# Patient Record
Sex: Female | Born: 2005 | Race: Black or African American | Hispanic: No | Marital: Single | Smoking: Never smoker
Health system: Southern US, Community
[De-identification: ages and names within clinical notes are randomized; demographics above are authoritative.]

## PROBLEM LIST (undated history)

## (undated) ENCOUNTER — Ambulatory Visit: Admission: EM | Source: Home / Self Care

## (undated) HISTORY — PX: NO PAST SURGERIES: SHX2092

---
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
## 2024-04-13 ENCOUNTER — Ambulatory Visit
Admission: EM | Admit: 2024-04-13 | Discharge: 2024-04-13 | Disposition: A | Discharge status: Home / Self Care | Attending: Emergency Medicine | Admitting: Emergency Medicine

## 2024-04-13 DIAGNOSIS — N898 Other specified noninflammatory disorders of vagina: Secondary | ICD-10-CM | POA: Diagnosis not present

## 2024-04-13 DIAGNOSIS — R300 Dysuria: Secondary | ICD-10-CM | POA: Diagnosis not present

## 2024-04-13 DIAGNOSIS — R310 Gross hematuria: Secondary | ICD-10-CM

## 2024-04-13 DIAGNOSIS — Z113 Encounter for screening for infections with a predominantly sexual mode of transmission: Secondary | ICD-10-CM

## 2024-04-13 LAB — POCT URINALYSIS DIP (MANUAL ENTRY)
Bilirubin, UA: NEGATIVE
Glucose, UA: NEGATIVE mg/dL
Ketones, POC UA: NEGATIVE mg/dL
Nitrite, UA: NEGATIVE
Protein Ur, POC: >=300 mg/dL — AB
Spec Grav, UA: 1.025 (ref 1.010–1.025)
Urobilinogen, UA: 0.2 U/dL
pH, UA: 5.5 (ref 5.0–8.0)

## 2024-04-13 LAB — POCT URINE PREGNANCY: Preg Test, Ur: NEGATIVE

## 2024-04-13 MED ORDER — CEPHALEXIN 500 MG PO CAPS: 500.0000 mg | ORAL_CAPSULE | Freq: Three times a day (TID) | ORAL | 0 refills | Status: AC

## 2024-04-13 NOTE — ED Triage Notes (Addendum)
 Pt c/o urinary frequency, burning with urination, blood in urine x 1 day. Pt states she is having a vaginal odor that started Monday.

## 2024-04-13 NOTE — Discharge Instructions (Addendum)
 Take the antibiotic as directed.  The urine culture is pending.  We will call you if it shows the need to change or discontinue your antibiotic.    Your vaginal tests are pending.  If your test results are positive, we will call you.  Do not have sexual activity for at least 7 days.    Follow up with your primary care provider on Monday.  Go to the emergency department if you have worsening symptoms.

## 2024-04-13 NOTE — ED Provider Notes (Signed)
 Julia Sims    CSN: [id number] Arrival date & time: 04/13/24  1417      History   Chief Complaint Chief Complaint  Patient presents with   Urinary Frequency    HPI Julia Sims is a 18 y.o. female.  Patient presents with 1 day history of dysuria, urinary frequency, and hematuria.  She also reports vaginal odor x 4 days.  She denies fever, chills, abdominal pain, flank pain, vaginal discharge, pelvic pain.  No OTC medication taken today.  The history is provided by the patient and medical records.    History reviewed. No pertinent past medical history.  There are no active problems to display for this patient.   Past Surgical History:  Procedure Laterality Date   NO PAST SURGERIES      OB History   No obstetric history on file.      Home Medications    Prior to Admission medications   Medication Sig Start Date End Date Taking? Authorizing Provider  cephALEXin (KEFLEX) 500 MG capsule Take 1 capsule (500 mg total) by mouth 3 (three) times daily for 5 days. 04/13/24 04/18/24 Yes Corlis Burnard DEL, NP  medroxyPROGESTERone  (PROVERA ) 10 MG tablet Take 1 tablet (10 mg total) by mouth daily for 7 days. 10/20/23 10/27/23  Brimage, Vondra, DO  ondansetron  (ZOFRAN -ODT) 4 MG disintegrating tablet Take 1 tablet (4 mg total) by mouth every 8 (eight) hours as needed. 10/20/23   Brimage, Vondra, DO    Family History History reviewed. No pertinent family history.  Social History Social History   Tobacco Use   Smoking status: Never   Smokeless tobacco: Never  Vaping Use   Vaping status: Every Day  Substance Use Topics   Alcohol use: Yes    Comment: rare   Drug use: Yes    Types: Marijuana    Comment: daily     Allergies   Patient has no known allergies.   Review of Systems Review of Systems  Constitutional:  Negative for chills and fever.  Gastrointestinal:  Negative for abdominal pain.  Genitourinary:  Positive for dysuria, frequency and hematuria. Negative for  flank pain, pelvic pain and vaginal discharge.     Physical Exam Triage Vital Signs ED Triage Vitals [04/13/24 1427]  Encounter Vitals Group     BP 103/70     Girls Systolic BP Percentile      Girls Diastolic BP Percentile      Boys Systolic BP Percentile      Boys Diastolic BP Percentile      Pulse Rate 89     Resp 18     Temp 97.9 F (36.6 C)     Temp src      SpO2 97 %     Weight      Height      Head Circumference      Peak Flow      Pain Score      Pain Loc      Pain Education      Exclude from Growth Chart    No data found.  Updated Vital Signs BP 103/70   Pulse 89   Temp 97.9 F (36.6 C)   Resp 18   LMP 03/30/2024 (Approximate)   SpO2 97%   Visual Acuity Right Eye Distance:   Left Eye Distance:   Bilateral Distance:    Right Eye Near:   Left Eye Near:    Bilateral Near:     Physical Exam Constitutional:  General: She is not in acute distress. HENT:     Mouth/Throat:     Mouth: Mucous membranes are moist.  Cardiovascular:     Rate and Rhythm: Normal rate and regular rhythm.  Pulmonary:     Effort: Pulmonary effort is normal. No respiratory distress.  Abdominal:     Palpations: Abdomen is soft.     Tenderness: There is no abdominal tenderness. There is no right CVA tenderness, left CVA tenderness, guarding or rebound.  Genitourinary:    Comments: Patient declines GU exam. Neurological:     Mental Status: She is alert.      UC Treatments / Results  Labs (all labs ordered are listed, but only abnormal results are displayed) Labs Reviewed  POCT URINALYSIS DIP (MANUAL ENTRY) - Abnormal; Notable for the following components:      Result Value   Clarity, UA cloudy (*)    Blood, UA large (*)    Protein Ur, POC >=300 (*)    Leukocytes, UA Moderate (2+) (*)    All other components within normal limits  URINE CULTURE  POCT URINE PREGNANCY  CERVICOVAGINAL ANCILLARY ONLY    EKG   Radiology No results  found.  Procedures Procedures (including critical care time)  Medications Ordered in UC Medications - No data to display  Initial Impression / Assessment and Plan / UC Course  I have reviewed the triage vital signs and the nursing notes.  Pertinent labs & imaging results that were available during my care of the patient were reviewed by me and considered in my medical decision making (see chart for details).    Gross hematuria, dysuria, vaginal odor, STD screening.  Afebrile and vital signs are stable.  Patient declines GU exam today.  Treating with Keflex. Urine culture pending. Discussed with patient that we will call her if the urine culture shows the need to change or discontinue the antibiotic. Patient obtained vaginal self swab for testing.  Discussed that we will call if test results are positive.  Discussed that she may require treatment at that time.  Instructed patient to abstain from sexual activity for at least 7 days.  Instructed her to follow-up with her PCP on Monday.  ED precautions discussed.  Patient agrees to plan of care.      Final Clinical Impressions(s) / UC Diagnoses   Final diagnoses:  Gross hematuria  Dysuria  Vaginal odor  Screening for STD (sexually transmitted disease)     Discharge Instructions      Take the antibiotic as directed.  The urine culture is pending.  We will call you if it shows the need to change or discontinue your antibiotic.    Your vaginal tests are pending.  If your test results are positive, we will call you.  Do not have sexual activity for at least 7 days.    Follow up with your primary care provider on Monday.  Go to the emergency department if you have worsening symptoms.          ED Prescriptions     Medication Sig Dispense Auth. Provider   cephALEXin (KEFLEX) 500 MG capsule Take 1 capsule (500 mg total) by mouth 3 (three) times daily for 5 days. 15 capsule Corlis Burnard DEL, NP      PDMP not reviewed this encounter.    Corlis Burnard DEL, NP 04/13/24 1447

## 2024-04-15 LAB — URINE CULTURE: Culture: 40000 — AB

## 2024-04-17 ENCOUNTER — Ambulatory Visit (HOSPITAL_COMMUNITY): Payer: Self-pay

## 2024-04-17 LAB — CERVICOVAGINAL ANCILLARY ONLY
Bacterial Vaginitis (gardnerella): POSITIVE — AB
Candida Glabrata: NEGATIVE
Candida Vaginitis: NEGATIVE
Chlamydia: NEGATIVE
Comment: NEGATIVE
Comment: NEGATIVE
Comment: NEGATIVE
Comment: NEGATIVE
Comment: NEGATIVE
Comment: NORMAL
Neisseria Gonorrhea: NEGATIVE
Trichomonas: NEGATIVE

## 2024-04-18 MED ORDER — METRONIDAZOLE 500 MG PO TABS: 500.0000 mg | ORAL_TABLET | Freq: Two times a day (BID) | ORAL | 0 refills | Status: AC

## 2024-09-03 ENCOUNTER — Other Ambulatory Visit: Payer: Self-pay

## 2024-09-03 ENCOUNTER — Emergency Department
Admission: EM | Admit: 2024-09-03 | Discharge: 2024-09-03 | Disposition: A | Discharge status: Home / Self Care | Attending: Emergency Medicine | Admitting: Emergency Medicine

## 2024-09-03 ENCOUNTER — Encounter: Payer: Self-pay | Admitting: Emergency Medicine

## 2024-09-03 DIAGNOSIS — M542 Cervicalgia: Secondary | ICD-10-CM | POA: Diagnosis present

## 2024-09-03 DIAGNOSIS — Y92410 Unspecified street and highway as the place of occurrence of the external cause: Secondary | ICD-10-CM | POA: Insufficient documentation

## 2024-09-03 DIAGNOSIS — R202 Paresthesia of skin: Secondary | ICD-10-CM

## 2024-09-03 DIAGNOSIS — S161XXA Strain of muscle, fascia and tendon at neck level, initial encounter: Secondary | ICD-10-CM | POA: Diagnosis not present

## 2024-09-03 DIAGNOSIS — M62830 Muscle spasm of back: Secondary | ICD-10-CM | POA: Diagnosis not present

## 2024-09-03 MED ORDER — ACETAMINOPHEN 500 MG PO TABS
1000.0000 mg | ORAL_TABLET | Freq: Once | ORAL | Status: AC
  Administered 2024-09-03: 1000 mg via ORAL
  Filled 2024-09-03: qty 2

## 2024-09-03 MED ORDER — KETOROLAC TROMETHAMINE 30 MG/ML IJ SOLN
30.0000 mg | Freq: Once | INTRAMUSCULAR | Status: AC
  Administered 2024-09-03: 30 mg via INTRAMUSCULAR
  Filled 2024-09-03: qty 1

## 2024-09-03 MED ORDER — LIDOCAINE 5 % EX PTCH
1.0000 | MEDICATED_PATCH | Freq: Two times a day (BID) | CUTANEOUS | 1 refills | Status: AC
Start: 2024-09-03 — End: 2025-09-03

## 2024-09-03 MED ORDER — LIDOCAINE 5 % EX PTCH
2.0000 | MEDICATED_PATCH | CUTANEOUS | Status: DC
  Administered 2024-09-03: 2 via TRANSDERMAL
  Filled 2024-09-03: qty 2
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
## 2024-09-03 NOTE — Discharge Instructions (Addendum)
Please take Tylenol and ibuprofen/Advil for your pain.  It is safe to take them together, or to alternate them every few hours.  Take up to 1000mg of Tylenol at a time, up to 4 times per day.  Do not take more than 4000 mg of Tylenol in 24 hours.  For ibuprofen, take 400-600 mg, 3 - 4 times per day.  Please use lidocaine patches at your site of pain.  Apply 1 patch at a time, leave on for 12 hours, then remove for 12 hours.  12 hours on, 12 hours off.  Do not apply more than 1 patch at a time.  
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
## 2024-09-21 ENCOUNTER — Telehealth: Admitting: Physician Assistant

## 2024-09-21 DIAGNOSIS — N939 Abnormal uterine and vaginal bleeding, unspecified: Secondary | ICD-10-CM

## 2024-09-21 NOTE — Patient Instructions (Signed)
°  Julia Sims, thank you for joining Elsie Velma Lunger, PA-C for today's virtual visit.  While this provider is not your primary care provider (PCP), if your PCP is located in our provider database this encounter information will be shared with them immediately following your visit.   A [facility] MyChart account gives you access to today's visit and all your visits, tests, and labs performed at [facility]  click here if you don't have a [facility] MyChart account or go to [email or web address]  Consent: (Patient) Julia Sims provided verbal consent for this virtual visit at the beginning of the encounter.  Current Medications:  Current Outpatient Medications:    lidocaine  (LIDODERM ) 5 %, Place 1 patch onto the skin every 12 (twelve) hours. Remove & Discard patch within 12 hours or as directed by MD, Disp: 10 patch, Rfl: 1   medroxyPROGESTERone  (PROVERA ) 10 MG tablet, Take 1 tablet (10 mg total) by mouth daily for 7 days., Disp: 7 tablet, Rfl: 0   ondansetron  (ZOFRAN -ODT) 4 MG disintegrating tablet, Take 1 tablet (4 mg total) by mouth every 8 (eight) hours as needed., Disp: 20 tablet, Rfl: 0   Medications ordered in this encounter:  No orders of the defined types were placed in this encounter.    *If you need refills on other medications prior to your next appointment, please contact your pharmacy*  Follow-Up: Call back or seek an in-person evaluation if the symptoms worsen or if the condition fails to improve as anticipated.  [facility] [phone]  Other Instructions Take prescribed medications as directed. Tylenol  and Ibuprofen OTC for cramping. Heating pad to the area  5-10 minutes, a few times per days. If you note any non-resolving, new, or worsening symptoms despite treatment, please seek an in-person evaluation ASAP.     If you have been instructed to have an in-person evaluation today at a local Urgent Care facility, please use the  link below. It will take you to a list of all of our available [facility], including address, phone number and hours of operation. Please do not delay care.  [facility]  If you or a family member do not have a primary care provider, use the link below to schedule a visit and establish care. When you choose a [facility] primary care physician or advanced practice provider, you gain a long-term partner in health. Find a Primary Care Provider  Learn more about [facility]'s in-office and virtual care options: [facility] - Get Care Now

## 2024-09-21 NOTE — Progress Notes (Signed)
 Virtual Visit Consent   Julia Sims, you are scheduled for a virtual visit with a [facility] provider today. Just as with appointments in the office, your consent must be obtained to participate. Your consent will be active for this visit and any virtual visit you may have with one of our providers in the next 365 days. If you have a MyChart account, a copy of this consent can be sent to you electronically.  As this is a virtual visit, video technology does not allow for your provider to perform a traditional examination. This may limit your provider's ability to fully assess your condition. If your provider identifies any concerns that need to be evaluated in person or the need to arrange testing (such as labs, EKG, etc.), we will make arrangements to do so. Although advances in technology are sophisticated, we cannot ensure that it will always work on either your end or our end. If the connection with a video visit is poor, the visit may have to be switched to a telephone visit. With either a video or telephone visit, we are not always able to ensure that we have a secure connection.  By engaging in this virtual visit, you consent to the provision of healthcare and authorize for your insurance to be billed (if applicable) for the services provided during this visit. Depending on your insurance coverage, you may receive a charge related to this service.  I need to obtain your verbal consent now. Are you willing to proceed with your visit today? Julia Sims has provided verbal consent on 09/21/2024 for a virtual visit (video or telephone). Julia Sims, [place]  Date: 09/21/2024 7:34 PM   Virtual Visit via Video Note   I, Julia Sims, connected with  Linnette Panella  ([id number], Sep 22, 2006) on 09/21/2024 at  7:15 PM EST by a video-enabled telemedicine application and verified that I am speaking with the correct person using two identifiers.  Location: Patient: Virtual Visit Location  Patient: Home Provider: Virtual Visit Location Provider: Home Office   I discussed the limitations of evaluation and management by telemedicine and the availability of in person appointments. The patient expressed understanding and agreed to proceed.    History of Present Illness: Julia Sims is a 18 y.o. who identifies as a female who was assigned female at birth, and is being seen today for issue with abnormal uterine bleeding. Notes over past severe months having menstrual periods every couple of weeks, normal to heavy flow, lasting several days. Currently cycle started in the past day with heavy cramping and heavy flow. Denies lightheadedness, dizziness or shortness of breath. Is taking Ibuprofen with some relief in cramping.   HPI: HPI  Problems: There are no active problems to display for this patient.   Allergies: Allergies[1] Medications: Current Medications[2]  Observations/Objective: Patient is well-developed, well-nourished in no acute distress.  Resting comfortably at home.  Head is normocephalic, atraumatic.  No labored breathing.  Speech is clear and coherent with logical content.  Patient is alert and oriented at baseline.   Assessment and Plan: 1. Abnormal uterine bleeding (AUB) (Primary) - medroxyPROGESTERone  (PROVERA ) 10 MG tablet; Take 1 tablet (10 mg total) by mouth in the morning and at bedtime. Use for shortest duration needed to slow menstrual bleeding.  Dispense: 10 tablet; Refill: 0  Wills tart provera  to lessen bleeding giving heaviness of flow and frequent symptoms. She has appt to establish with new provider this coming month for further evaluation. Tylenol  and Ibuprofen OTC. Heating  pad. Work note provided. Strict in-person evaluation precautions reviewed.  Follow Up Instructions: I discussed the assessment and treatment plan with the patient. The patient was provided an opportunity to ask questions and all were answered. The patient agreed with the plan  and demonstrated an understanding of the instructions.  A copy of instructions were sent to the patient via MyChart unless otherwise noted below.   The patient was advised to call back or seek an in-person evaluation if the symptoms worsen or if the condition fails to improve as anticipated.    Julia Velma Lunger, PA-C    [1] No Known Allergies [2]  Current Outpatient Medications:    medroxyPROGESTERone  (PROVERA ) 10 MG tablet, Take 1 tablet (10 mg total) by mouth in the morning and at bedtime. Use for shortest duration needed to slow menstrual bleeding., Disp: 10 tablet, Rfl: 0
